# Patient Record
Sex: Male | Born: 1990
Health system: Southern US, Community
[De-identification: ages and names within clinical notes are randomized; demographics above are authoritative.]

---
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
## 2010-01-20 ENCOUNTER — Ambulatory Visit: Payer: Self-pay | Admitting: Diagnostic Radiology

## 2010-01-20 ENCOUNTER — Emergency Department (HOSPITAL_BASED_OUTPATIENT_CLINIC_OR_DEPARTMENT_OTHER): Admission: EM | Admit: 2010-01-20 | Discharge: 2010-01-20 | Payer: Self-pay | Admitting: Emergency Medicine

## 2015-06-26 ENCOUNTER — Encounter (HOSPITAL_COMMUNITY): Payer: Self-pay | Admitting: Emergency Medicine

## 2015-06-26 ENCOUNTER — Emergency Department (HOSPITAL_COMMUNITY)
Admission: EM | Admit: 2015-06-26 | Discharge: 2015-06-26 | Disposition: A | Discharge status: Home / Self Care | Payer: 59 | Attending: Emergency Medicine | Admitting: Emergency Medicine

## 2015-06-26 ENCOUNTER — Emergency Department (HOSPITAL_COMMUNITY): Payer: 59

## 2015-06-26 DIAGNOSIS — Y9289 Other specified places as the place of occurrence of the external cause: Secondary | ICD-10-CM | POA: Diagnosis not present

## 2015-06-26 DIAGNOSIS — Y9364 Activity, baseball: Secondary | ICD-10-CM | POA: Diagnosis not present

## 2015-06-26 DIAGNOSIS — S63502A Unspecified sprain of left wrist, initial encounter: Secondary | ICD-10-CM | POA: Insufficient documentation

## 2015-06-26 DIAGNOSIS — X58XXXA Exposure to other specified factors, initial encounter: Secondary | ICD-10-CM | POA: Diagnosis not present

## 2015-06-26 DIAGNOSIS — Y998 Other external cause status: Secondary | ICD-10-CM | POA: Insufficient documentation

## 2015-06-26 DIAGNOSIS — S6992XA Unspecified injury of left wrist, hand and finger(s), initial encounter: Secondary | ICD-10-CM | POA: Diagnosis present

## 2015-06-26 MED ORDER — IBUPROFEN 800 MG PO TABS: 800.0000 mg | ORAL_TABLET | Freq: Three times a day (TID) | ORAL | Status: DC

## 2015-06-26 MED ORDER — IBUPROFEN 800 MG PO TABS
800.0000 mg | ORAL_TABLET | Freq: Once | ORAL | Status: AC
  Administered 2015-06-26: 800 mg via ORAL
  Filled 2015-06-26: qty 1

## 2015-06-26 NOTE — ED Notes (Signed)
Per pt, states he was coming around 3 rd base and slipped-braced self with left wrist, now having pain and swelling

## 2015-06-26 NOTE — Discharge Instructions (Signed)
1. Medications: ibuprofen, usual home medications 2. Treatment: rest, drink plenty of fluids, ice, elevate 3. Follow Up: please followup with your primary doctor for discussion of your diagnoses and further evaluation after today's visit; if you do not have a primary care doctor use the resource guide provided to find one; please return to the ER for severe pain, numbness, tingling, color change, new or worsening symptoms   Sprain A sprain happens when the bands of tissue that connect bones and hold joints together (ligaments) stretch too much or tear. HOME CARE  Raise (elevate) the injured area to lessen puffiness (swelling).  Put ice on the injured area 2 times a day for 2-3 days.  Put ice in a plastic bag.  Place a towel between your skin and the bag.  Leave the ice on for 15 minutes.  Only take medicine as told by your doctor.  Protect your injured area until your pain and stiffness go away.  Do not get your cast or splint wet. Cover your cast or splint with a plastic bag when you shower or take a bath. Do not swim in a pool.  Your doctor may suggest exercises during your recovery to keep from getting stiff. GET HELP RIGHT AWAY IF:   Your cast or splint becomes damaged.  Your pain gets worse. MAKE SURE YOU:   Understand these instructions.  Will watch this condition.  Will get help right away if you are not doing well or get worse. Document Released: 02/25/2008 Document Revised: 06/29/2013 Document Reviewed: 09/20/2011 [facility] Patient Information 2015 [place], [place]. This information is not intended to replace advice given to you by your health care provider. Make sure you discuss any questions you have with your health care provider.   Emergency Department Resource Guide 1) Find a Doctor and Pay Out of Pocket Although you won't have to find out who is covered by your insurance plan, it is a good idea to ask around and get recommendations. You will then need to call  the office and see if the doctor you have chosen will accept you as a new patient and what types of options they offer for patients who are self-pay. Some doctors offer discounts or will set up payment plans for their patients who do not have insurance, but you will need to ask so you aren't surprised when you get to your appointment.  2) Contact Your Local Health Department Not all health departments have doctors that can see patients for sick visits, but many do, so it is worth a call to see if yours does. If you don't know where your local health department is, you can check in your phone book. The CDC also has a tool to help you locate your state's health department, and many state websites also have listings of all of their local health departments.  3) Find a Walk-in Clinic If your illness is not likely to be very severe or complicated, you may want to try a walk in clinic. These are popping up all over the country in pharmacies, drugstores, and shopping centers. They're usually staffed by nurse practitioners or physician assistants that have been trained to treat common illnesses and complaints. They're usually fairly quick and inexpensive. However, if you have serious medical issues or chronic medical problems, these are probably not your best option.  No Primary Care Doctor: - Call Health Connect at  [phone] - they can help you locate a primary care doctor that  accepts your insurance, provides  certain services, etc. - Physician Referral Service- [phone]  Chronic Pain Problems: Organization         Address  Phone   Notes  [facility]  [phone] Patients need to be referred by their primary care doctor.   Medication Assistance: Organization         Address  Phone   Notes  [facility] Medication [facility] [address] [place] [place], [place] [address] [phone] --Must be a resident of [facility] -- Must have NO insurance  coverage whatsoever (no Medicaid/ Medicare, etc.) -- The pt. MUST have a primary care doctor that directs their care regularly and follows them in the community   [facility]  [phone]   [employer]  [phone]    Agencies that provide inexpensive medical care: Organization         Address  Phone   Notes  [facility]  [phone]   [facility]    [phone]   [facility] [address] [place], [place] [address] [phone]   [facility] [address] [place], [place] [phone]   Planned Parenthood    [phone]   [facility]    [phone]   [facility]  [address], [place] Phone:  [phone], Fax:  [phone] Hours of Operation:  9 am - 6 pm, M-F.  Also accepts Medicaid/Medicare and self-pay.  [facility]  [address], [place] Phone: [phone], Fax: [phone]. Hours of Operation:  8:30 am - 5:30 pm, M-F.  Also accepts Medicaid and self-pay.  [facility] [place] [address] [place] Phone: [phone]   [facility] [address] Natasha Bence [place], [place] [phone], Ext. [phone] Mondays & Thursdays: 7-9 AM.  First 15 patients are seen on a first come, first serve basis.    Medicaid-accepting [facility] Providers:  Organization         Address  Phone   Notes  [facility] [address], [place] [phone] Also accepts self-pay patients.  [facility] [address] Laurell Josephs [place], [place]  [phone]   [facility] [address], [place] [phone]   [facility] [address] [place] [phone]   Renaye Rakers [address], [place]   [phone] Only accepts [place] patients after they have their  name applied to their card.   Self-Pay (no insurance) in [facility]:  Organization         Address  Phone   Notes  Sickle Cell Patients, [facility] Internal Medicine [address] [place], [place] [phone]   [facility]gent Care [address] [place], [place] [phone]   [facility] [place]  [address], [place] [phone]   [facility]/Dr. Osei-Bonsu  [address] [place] or [address], [place] [phone] Phone number for both [place] and [place] locations is the same.  [facility] [address] [place] [phone]   [facility] [address], [place] or [address] [phone]   [facility] [address]  [address] [place] [phone], phone; [phone], fax Sees patients 1st and 3rd Saturday of every month.  Must not qualify for public or private insurance (i.e. Medicaid, Medicare, [place] Health Choice, Veterans' Benefits)  Household income should be no more than 200% of the poverty level The clinic cannot treat you if you are pregnant or think you are pregnant  Sexually transmitted diseases are not treated at the clinic.    Dental Care: Organization         Address  Phone  Notes  [facility] Department of [facility] [address] [place], [place] [phone] Accepts children up to age 51 who are enrolled in [place] or [place] Health Choice; pregnant women with a Medicaid card; and children who have applied for Medicaid or [place] Health Choice, but were declined, whose parents can pay a reduced fee at time of service.  [facility] Department of [facility]  [address], [place] [phone] Accepts children up to age 45 who are enrolled in [place] or [place] Health Choice; pregnant women with a Medicaid card; and children who have applied for  Medicaid or [place] Health Choice, but were declined, whose parents can pay a reduced fee at time of service.  [facility]  [address] [place], [place] [phone] Patients are seen by appointment only. Walk-ins are not accepted. [facility] will see patients 26 years of age and older. Monday - Tuesday (8am-5pm) Most Wednesdays (8:30-5pm) $30 per visit, cash only  [facility]  [address], [facility] [phone] Patients are seen by appointment only. Walk-ins are not accepted. [facility] will see patients 70 years of age and older. One Wednesday Evening (Monthly: Volunteer Based).  $30 per visit, cash only  [employer]  [phone] for adults; Children under age 91, call Graduate Pediatric Dentistry at [phone]. Children aged 68-14, please call [phone] to request a pediatric application.  Dental services are provided in all areas of dental care including fillings, crowns and bridges, complete and partial dentures, implants, gum treatment, root canals, and extractions. Preventive care is also provided. Treatment is provided to both adults and children. Patients are selected via a lottery and there is often a waiting list.   [facility] [address], [place]  [phone] [email or web address]   [facility] [address] [place], [place] [phone], Ext. [phone] Second and Fourth Thursday of each month, opens at 6:30 AM; Clinic ends at 9 AM.  Patients are seen on a first-come first-served basis, and a limited number are seen during each clinic.   [facility]  [address] Ether Griffins [place], [place] [phone]   Eligibility Requirements You must have lived in [place], [place], or [place] counties for at least the last three months.   You cannot be eligible for state or federal sponsored [place], including [employer], [place],  or [employer].   You generally cannot be eligible for healthcare insurance through your employer.    How to apply: Eligibility screenings are held every Tuesday and Wednesday afternoon from 1:00 pm until 4:00 pm. You do not need an appointment for the interview!  [facility] [address] [place], [place] [phone]   [facility] Health Department  [phone]   [facility] Health Department  [phone]   [facility] Health Department  [phone]  Behavioral Health Resources in the Community: Intensive Outpatient Programs Organization         Address  Phone  Notes  [facility] [address], [place], [place] [phone]   [facility] [address], [place], [place] [phone]   [facility] [address], [place], [place]  [phone]   [facility] [address],  [place], [place] [phone] or [phone]   Substance Abuse Resources Organization         Address  Phone  Notes  [facility]  [phone]   [facility]  [phone]   [facility]  [phone]   [facility]  [phone]   Residential & Outpatient Substance Abuse Program  [phone]   Psychological Services Organization         Address  Phone  Notes  [facility]  [phone]- [phone]   [facility] [facility]  [phone]- [phone]   [facility] [address], [place] [phone] or [phone]    Mobile Crisis Teams Organization         Address  Phone  Notes  [facility], Mobile Crisis Care Unit  [phone]   [facility]  [address] [place], [place] [phone]   Doristine LocksSharon DeEsch [address] [place] [phone]    Self-Help/Support Groups Organization         Address  Phone             Notes  [facility] [place] - variety of support groups   336- [phone] Call for more information  Narcotics Anonymous (NA), [facility] [address], [place]  2 meetings at this location   Statisticianesidential Treatment Programs Organization         Address  Phone  Notes  [facility] [address],    [place]  [phone]   [facility]  [address], [place] [phone], [place], [place] [phone]   [facility] [address], [place] [phone] Admissions: 8am-3pm M-F  [facility] [address]    [place], [place] [phone]   [facility] [address], [place], [place] [phone]   [facility] [address]  [place], [place] [phone]   [facility] - Intensive Outpatient [address], [place], [place] [phone]   [facility] [address]  [place], [place] [phone] or [phone]   [facility] ([facility]) [address] [place], [place] [phone] Accepts Medicaid  [facility] [address]  [place] [phone] Substance Abuse/Addiction Treatment   [facility] Resources Organization         Address  Phone  Notes  [facility]  [phone]) [phone]   Angie FavaJulie Brannon, PhD [address] [place], [place]   [phone] or [phone]   [facility]   [address] [place], [place] [phone]   [facility] [address], [place], [place] [phone] Insurance/Medicaid/sponsorship through [employer] [facility]  [facility] [address]                                    [place], [place] [phone] Therapy/tele-psych/case  [facility] [address]   [place],  [place] [phone]    Dr. Lolly Mustache  [phone]   [facility]  [employer] [facility] Dept. 1) [address] [place] 2) [address], [place] 3)  [address] [place]  [address], [place] [phone]  [phone]   [facility] Child Abuse Hotline 3618663965 or [phone] (After Hours)

## 2015-06-26 NOTE — ED Provider Notes (Signed)
CSN: [id number]     Arrival date & time 06/26/15  1433 History   By signing my name below, I, Jarvis Morgan, attest that this documentation has been prepared under the direction and in the presence of Glean Hess, [place]. Electronically Signed: Jarvis Morgan, ED Scribe. 06/26/2015. 3:48 PM.    Chief Complaint  Patient presents with  . Wrist Injury    The history is provided by the patient. No language interpreter was used.     HPI Comments: Jerry Hancock is a 24 y.o. male who presents to the Emergency Department complaining of sudden onset, constant, moderate, left wrist pain s/p left wrist injury while playing softball last night. He states he was coming around 3rd base and slipped and braced himself with his left wrist. He reports associated swelling of his left wrist. He reports movement exacerbates his pain. He states has tried ice and tylenol with mild symptom relief. He denies head injury or LOC. He denies additional injury. He denies numbness, paresthesia, or weakness. He denies fever, chills, chest pain, SOB, abdominal pain, nausea, or vomiting.   History reviewed. No pertinent past medical history. History reviewed. No pertinent past surgical history. No family history on file. Social History  Substance Use Topics  . Smoking status: Never Smoker   . Smokeless tobacco: None  . Alcohol Use: No    Review of Systems  Constitutional: Negative for fever and chills.  Respiratory: Negative for shortness of breath.   Cardiovascular: Negative for chest pain.  Gastrointestinal: Negative for nausea, vomiting and abdominal pain.  Musculoskeletal: Positive for joint swelling and arthralgias.  Skin: Negative for color change.  Neurological: Negative for loss of consciousness, weakness, numbness and headaches.  All other systems reviewed and are negative.     Allergies  Review of patient's allergies indicates not on file.  Home Medications   Prior to Admission medications    Not on File   Triage Vitals: BP 148/81 mmHg  Pulse 82  Temp(Src) 99.1 F (37.3 C)  Resp 18  SpO2 100%  Physical Exam  Constitutional: He is oriented to person, place, and time. He appears well-developed and well-nourished. No distress.  HENT:  Head: Normocephalic and atraumatic.  Right Ear: External ear normal.  Left Ear: External ear normal.  Nose: Nose normal.  Mouth/Throat: Oropharynx is clear and moist.  Eyes: Conjunctivae and EOM are normal. Pupils are equal, round, and reactive to light. Right eye exhibits no discharge. Left eye exhibits no discharge. No scleral icterus.  Neck: Normal range of motion. Neck supple.  Cardiovascular: Normal rate, regular rhythm and intact distal pulses.   Pulmonary/Chest: Effort normal and breath sounds normal. No respiratory distress.  Musculoskeletal: He exhibits edema and tenderness.       Left wrist: He exhibits decreased range of motion, tenderness and swelling. He exhibits no crepitus and no deformity.  Mild TTP of radial aspect of left wrist with mild edema and decreased ROM due to pain. No significant erythema. Strength and sensation intact. Distal pulses intact. Cap refill <3 seconds. No snuffbox tenderness.  Neurological: He is alert and oriented to person, place, and time. He has normal strength. No sensory deficit.  Skin: Skin is warm and dry. No rash noted. He is not diaphoretic. No erythema. No pallor.  Psychiatric: He has a normal mood and affect. His behavior is normal. Judgment and thought content normal.  Nursing note and vitals reviewed.   ED Course  Procedures (including critical care time)  DIAGNOSTIC STUDIES: Oxygen  Saturation is 100% on RA, normal by my interpretation.    COORDINATION OF CARE:  3:12 PM- Will order imaging of left wrist. Pt advised of plan for treatment and pt agrees.  Labs Review Labs Reviewed - No data to display  Imaging Review Dg Wrist Complete Left  06/26/2015   CLINICAL DATA:  Softball  injury today with left wrist pain. Initial encounter.  EXAM: LEFT WRIST - COMPLETE 3+ VIEW  COMPARISON:  01/20/2010  FINDINGS: There is no evidence of acute fracture or dislocation. There is no evidence of arthropathy or other focal bone abnormality. Soft tissues are unremarkable.  IMPRESSION: No acute fracture identified.   Electronically Signed   By: Irish Lack M.D.   On: 06/26/2015 15:42   I have personally reviewed and evaluated these images as part of my medical decision-making.   EKG Interpretation None      MDM   Final diagnoses:  Wrist sprain, left, initial encounter    24 year old male presents with left wrist pain after falling and bracing himself with his left wrist yesterday while playing softball. He reports worsening pain and swelling since that time. He denies numbness, paresthesia, weakness, additional injury. He denies hitting his head or LOC.  Patient is afebrile. Vital signs stable. Mild TTP of radial aspect of left wrist with mild edema and decreased ROM due to pain. No snuffbox tenderness. Strength and sensation intact. Distal pulses intact. Cap refill <3 seconds.  Imaging of left wrist obtained, which demonstrates no fracture, dislocation, or soft tissue abnormality. Feel patient is stable for discharge at this time. Will apply brace and treat with ibuprofen and RICE therapy. Patient to follow up with PCP, resource list given. Return precautions discussed.  BP 148/81 mmHg  Pulse 61  Temp(Src) 99.1 F (37.3 C)  Resp 18  SpO2 99%    Mady Gemma, PA-C 06/27/15 1825  Gerhard Munch, MD 06/28/15 [id number]

## 2018-01-11 MED FILL — NALTREXONE 50 MG TABLET: 50 | 30 days supply | Qty: 30 | Fill #0

## 2018-01-11 MED FILL — MIRTAZAPINE 15 MG TABLET: 15 | 60 days supply | Qty: 30 | Fill #0

## 2018-02-12 MED FILL — NALTREXONE 50 MG TABLET: 50 | 30 days supply | Qty: 30 | Fill #1

## 2018-04-09 MED FILL — NALTREXONE 50 MG TABLET: 50 | 30 days supply | Qty: 30 | Fill #2

## 2018-04-09 MED FILL — MIRTAZAPINE 15 MG TABLET: 15 | 60 days supply | Qty: 30 | Fill #1

## 2020-06-19 ENCOUNTER — Other Ambulatory Visit: Payer: Self-pay

## 2020-06-19 ENCOUNTER — Ambulatory Visit
Admission: EM | Admit: 2020-06-19 | Discharge: 2020-06-19 | Disposition: A | Discharge status: Home / Self Care | Payer: Self-pay | Attending: Family Medicine | Admitting: Family Medicine

## 2020-06-19 ENCOUNTER — Ambulatory Visit (INDEPENDENT_AMBULATORY_CARE_PROVIDER_SITE_OTHER): Payer: Self-pay

## 2020-06-19 DIAGNOSIS — R0602 Shortness of breath: Secondary | ICD-10-CM | POA: Diagnosis not present

## 2020-06-19 DIAGNOSIS — J45909 Unspecified asthma, uncomplicated: Secondary | ICD-10-CM

## 2020-06-19 DIAGNOSIS — J209 Acute bronchitis, unspecified: Secondary | ICD-10-CM

## 2020-06-19 DIAGNOSIS — Z20822 Contact with and (suspected) exposure to covid-19: Secondary | ICD-10-CM

## 2020-06-19 DIAGNOSIS — R062 Wheezing: Secondary | ICD-10-CM

## 2020-06-19 MED ORDER — METHYLPREDNISOLONE SODIUM SUCC 125 MG IJ SOLR
80.0000 mg | Freq: Once | INTRAMUSCULAR | Status: AC
  Administered 2020-06-19: 80 mg via INTRAMUSCULAR

## 2020-06-19 MED ORDER — AZITHROMYCIN 250 MG PO TABS: 250.0000 mg | ORAL_TABLET | Freq: Every day | ORAL | 0 refills | Status: DC

## 2020-06-19 MED ORDER — ALBUTEROL SULFATE HFA 108 (90 BASE) MCG/ACT IN AERS
2.0000 | INHALATION_SPRAY | Freq: Once | RESPIRATORY_TRACT | Status: AC
  Administered 2020-06-19: 2 via RESPIRATORY_TRACT

## 2020-06-19 NOTE — ED Provider Notes (Addendum)
[name]    CSN: [id number] Arrival date & time: 06/19/20  2025      History   Chief Complaint Chief Complaint  Patient presents with  . Shortness of Breath    HPI Jerry Hancock is a 29 y.o. male.   Patient presents with 1 week history of wheezing and shortness of breath.  There is no history of asthma but he does say that he has had this other times and seems to be seasonally related.  He has a cough that is occasionally productive of dark sputum.  He denies fever or chills.  No other symptoms that are suggestive of Covid.  Has not had immunization  HPI  No past medical history on file.  There are no problems to display for this patient.   No past surgical history on file.     Home Medications    Prior to Admission medications   Medication Sig Start Date End Date Taking? Authorizing Provider  ibuprofen (ADVIL,MOTRIN) 800 MG tablet Take 1 tablet (800 mg total) by mouth 3 (three) times daily. 06/26/15   Mady Gemma, PA-C    Family History No family history on file.  Social History Social History   Tobacco Use  . Smoking status: Never Smoker  Substance Use Topics  . Alcohol use: No  . Drug use: Not on file     Allergies   Patient has no allergy information on record.   Review of Systems Review of Systems  Respiratory: Positive for cough, shortness of breath and wheezing.   All other systems reviewed and are negative.    Physical Exam Triage Vital Signs ED Triage Vitals  Enc Vitals Group     BP      Pulse      Resp      Temp      Temp src      SpO2      Weight      Height      Head Circumference      Peak Flow      Pain Score      Pain Loc      Pain Edu?      Excl. in GC?    No data found.  Updated Vital Signs There were no vitals taken for this visit.  Visual Acuity Right Eye Distance:   Left Eye Distance:   Bilateral Distance:    Right Eye Near:   Left Eye Near:    Bilateral Near:     Physical  Exam Vitals and nursing note reviewed.  Constitutional:      Appearance: He is well-developed and normal weight.  HENT:     Mouth/Throat:     Mouth: Mucous membranes are moist.  Cardiovascular:     Rate and Rhythm: Normal rate and regular rhythm.  Pulmonary:     Breath sounds: Examination of the right-upper field reveals wheezing. Examination of the left-upper field reveals wheezing and rhonchi. Examination of the right-middle field reveals wheezing. Examination of the left-middle field reveals wheezing. Examination of the right-lower field reveals wheezing. Examination of the left-lower field reveals wheezing. Wheezing and rhonchi present.  Musculoskeletal:     Cervical back: Normal range of motion.  Neurological:     Mental Status: He is alert.      UC Treatments / Results  Labs (all labs ordered are listed, but only abnormal results are displayed) Labs Reviewed - No data to display  EKG   Radiology  No results found.  Procedures Procedures (including critical care time)  Medications Ordered in UC Medications - No data to display  Initial Impression / Assessment and Plan / UC Course  I have reviewed the triage vital signs and the nursing notes.  Pertinent labs & imaging results that were available during my care of the patient were reviewed by me and considered in my medical decision making (see chart for details).     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  X-ray interpretation was possible right middle lobe pneumonia is so will do Covid testing and azithromycin to albuterol and steroid injection Final Clinical Impressions(s) / UC Diagnoses   Final diagnoses:  None   Discharge Instructions   None    ED Prescriptions    None     PDMP not reviewed this encounter.   Frederica Kuster, MD 06/19/20 1207    Frederica Kuster, MD 06/19/20 [phone]

## 2020-06-19 NOTE — ED Triage Notes (Signed)
Pt c/o increase SOB with wheezing x3 days. States had this with cold and congestion 6weeks ago, it went away and came back a few times.states this time is worse.

## 2020-06-20 LAB — SARS-COV-2, NAA 2 DAY TAT

## 2020-06-20 LAB — NOVEL CORONAVIRUS, NAA: SARS-CoV-2, NAA: NOT DETECTED

## 2020-07-23 ENCOUNTER — Other Ambulatory Visit: Payer: Self-pay

## 2020-07-23 ENCOUNTER — Ambulatory Visit (INDEPENDENT_AMBULATORY_CARE_PROVIDER_SITE_OTHER): Payer: Self-pay

## 2020-07-23 ENCOUNTER — Ambulatory Visit
Admission: RE | Admit: 2020-07-23 | Discharge: 2020-07-23 | Disposition: A | Discharge status: Home / Self Care | Payer: Self-pay | Source: Ambulatory Visit | Attending: Emergency Medicine | Admitting: Emergency Medicine

## 2020-07-23 VITALS — BP 137/82 | HR 106 | Temp 97.8°F | Resp 22

## 2020-07-23 DIAGNOSIS — R0602 Shortness of breath: Secondary | ICD-10-CM

## 2020-07-23 DIAGNOSIS — F17200 Nicotine dependence, unspecified, uncomplicated: Secondary | ICD-10-CM

## 2020-07-23 DIAGNOSIS — Z1152 Encounter for screening for COVID-19: Secondary | ICD-10-CM

## 2020-07-23 MED ORDER — ALBUTEROL SULFATE HFA 108 (90 BASE) MCG/ACT IN AERS
2.0000 | INHALATION_SPRAY | Freq: Once | RESPIRATORY_TRACT | Status: AC
  Administered 2020-07-23: 2 via RESPIRATORY_TRACT

## 2020-07-23 MED ORDER — AEROCHAMBER PLUS FLO-VU MEDIUM MISC: 1.0000 | Freq: Once | 0 refills | Status: AC

## 2020-07-23 MED ORDER — PREDNISONE 50 MG PO TABS: 50.0000 mg | ORAL_TABLET | Freq: Every day | ORAL | 0 refills | Status: AC

## 2020-07-23 MED ORDER — METHYLPREDNISOLONE SODIUM SUCC 125 MG IJ SOLR
125.0000 mg | Freq: Once | INTRAMUSCULAR | Status: AC
  Administered 2020-07-23: 125 mg via INTRAMUSCULAR

## 2020-07-23 NOTE — ED Triage Notes (Signed)
Pt states he was prescribed an inhaler last visit and received a steroid shot that helped ease his symptoms. Pt states the inhaler ran out about 3 days ago and SOB congestion returned and are worsening. Pt is aox4 and ambulatory.

## 2020-07-23 NOTE — ED Provider Notes (Signed)
[name]    CSN: [id number] Arrival date & time: 07/23/20  0913      History   Chief Complaint Chief Complaint  Patient presents with  . Shortness of Breath    x 3 days, inhaler ran out  . Nasal Congestion    x 3 days    HPI Jerry Hancock is a 29 y.o. male  With history of smoking presenting for recurrent shortness of breath and wheezing.  Patient previously seen 06/19/2020 for this: Please see those records, viewed by me at time of visit.  Was given an inhaler, steroid shot, discharged with azithromycin due to possible infiltrate on x-ray.  States he is continue to smoke: Reports compliance with antibiotics.  Had complete improvement.  Denies inciting event, trauma.  No chest pain or palpitations, lightheadedness, dizziness, nausea or vomiting.  Denies recent illness, fever.  History reviewed. No pertinent past medical history.  There are no problems to display for this patient.   History reviewed. No pertinent surgical history.     Home Medications    Prior to Admission medications   Medication Sig Start Date End Date Taking? Authorizing Provider  predniSONE (DELTASONE) 50 MG tablet Take 1 tablet (50 mg total) by mouth daily with breakfast. 07/23/20   Hall-Potvin, [place], PA-C    Family History History reviewed. No pertinent family history.  Social History Social History   Tobacco Use  . Smoking status: Current Every Day Smoker    Packs/day: 0.50    Types: Cigarettes  . Smokeless tobacco: Never Used  Vaping Use  . Vaping Use: Never used  Substance Use Topics  . Alcohol use: Yes    Comment: occ  . Drug use: Never     Allergies   Patient has no known allergies.   Review of Systems Review of Systems  Constitutional: Negative for fatigue and fever.  Respiratory: Positive for chest tightness, shortness of breath and wheezing. Negative for cough.   Cardiovascular: Negative for chest pain and palpitations.  Gastrointestinal: Negative for  abdominal pain, diarrhea and vomiting.  Musculoskeletal: Negative for arthralgias and myalgias.  Skin: Negative for rash and wound.  Neurological: Negative for speech difficulty and headaches.  All other systems reviewed and are negative.    Physical Exam Triage Vital Signs ED Triage Vitals  Enc Vitals Group     BP 07/23/20 0923 137/82     Pulse Rate 07/23/20 0923 (!) 106     Resp 07/23/20 0923 (!) 22     Temp 07/23/20 0923 97.8 F (36.6 C)     Temp Source 07/23/20 0923 Oral     SpO2 07/23/20 0923 92 %     Weight --      Height --      Head Circumference --      Peak Flow --      Pain Score 07/23/20 0926 0     Pain Loc --      Pain Edu? --      Excl. in GC? --    No data found.  Updated Vital Signs BP 137/82 (BP Location: Left Arm)   Pulse (!) 106   Temp 97.8 F (36.6 C) (Oral)   Resp (!) 22   SpO2 92%   Visual Acuity Right Eye Distance:   Left Eye Distance:   Bilateral Distance:    Right Eye Near:   Left Eye Near:    Bilateral Near:     Physical Exam Constitutional:  General: He is not in acute distress.    Appearance: He is well-developed and normal weight. He is not toxic-appearing or diaphoretic.  HENT:     Head: Normocephalic and atraumatic.     Mouth/Throat:     Mouth: Mucous membranes are moist.     Pharynx: Oropharynx is clear. No pharyngeal swelling or oropharyngeal exudate.  Eyes:     General: No scleral icterus.    Pupils: Pupils are equal, round, and reactive to light.  Neck:     Vascular: No JVD.     Trachea: No tracheal deviation.  Cardiovascular:     Rate and Rhythm: Normal rate and regular rhythm.     Comments: HR 98 at bedside Pulmonary:     Effort: Pulmonary effort is normal. Tachypnea present. No accessory muscle usage.     Breath sounds: No stridor. Wheezing and rhonchi present. No decreased breath sounds or rales.     Comments: diffuse Chest:     Chest wall: No deformity, tenderness or crepitus.  Musculoskeletal:         General: Normal range of motion.     Right lower leg: No tenderness. No edema.     Left lower leg: No tenderness. No edema.  Lymphadenopathy:     Cervical: No cervical adenopathy.  Skin:    General: Skin is warm.     Capillary Refill: Capillary refill takes less than 2 seconds.     Coloration: Skin is not cyanotic, jaundiced or pale.  Neurological:     General: No focal deficit present.     Mental Status: He is alert and oriented to person, place, and time.  Psychiatric:        Mood and Affect: Mood normal.        Behavior: Behavior normal.      UC Treatments / Results  Labs (all labs ordered are listed, but only abnormal results are displayed) Labs Reviewed  NOVEL CORONAVIRUS, NAA    EKG   Radiology DG Chest 2 View  Result Date: 07/23/2020 CLINICAL DATA:  29 year old male with shortness of breath for 1 month. EXAM: CHEST - 2 VIEW COMPARISON:  Chest radiographs 06/19/2020 FINDINGS: Suboptimal radiographic technique similar to the study in September. Large lung volumes, but as before maintained curvature of the diaphragm. Mediastinal contours remain normal. Visualized tracheal air column is within normal limits. Lung markings appear within normal limits. No pneumothorax or pleural effusion. No osseous abnormality identified. IMPRESSION: Lung volumes upper limits of normal versus hyperinflated. No acute cardiopulmonary abnormality. Electronically Signed   By: Odessa Fleming M.D.   On: 07/23/2020 10:01    Procedures Procedures (including critical care time)  Medications Ordered in UC Medications  albuterol (VENTOLIN HFA) 108 (90 Base) MCG/ACT inhaler 2 puff (2 puffs Inhalation Given 07/23/20 0958)  methylPREDNISolone sodium succinate (SOLU-MEDROL) 125 mg/2 mL injection 125 mg (125 mg Intramuscular Given 07/23/20 0958)    Initial Impression / Assessment and Plan / UC Course  I have reviewed the triage vital signs and the nursing notes.  Pertinent labs & imaging results that were  available during my care of the patient were reviewed by me and considered in my medical decision making (see chart for details).     Patient afebrile, nontoxic, with SpO2 92%.  Covid PCR pending.  Patient to quarantine until results are back.  Chest x-ray with possible hyperinflation, otherwise unremarkable.  Given Solu-Medrol, albuterol in office which he tolerated well.  Reporting some improvement at time of discharge.  Provided contact information for both pulmonology and primary care for further evaluation and management.  Encouraged appropriate inhaler use, will add prednisone for 5 days hereafter.  We will treat supportively as outlined below.  Return precautions discussed, patient verbalized understanding and is agreeable to plan. Final Clinical Impressions(s) / UC Diagnoses   Final diagnoses:  Encounter for screening for COVID-19  SOB (shortness of breath)  Current smoker     Discharge Instructions     Your COVID test is pending - it is important to quarantine / isolate at home until your results are back. If you test positive and would like further evaluation for persistent or worsening symptoms, you may schedule an E-visit or virtual (video) visit throughout the [facility] app or website.  PLEASE NOTE: If you develop severe chest pain or shortness of breath please go to the ER or call [phone] for further evaluation --> DO NOT schedule electronic or virtual visits for this. Please call our office for further guidance / recommendations as needed.  For information about the Covid vaccine, please visit [email or web address]    ED Prescriptions    Medication Sig Dispense Auth. Provider   predniSONE (DELTASONE) 50 MG tablet Take 1 tablet (50 mg total) by mouth daily with breakfast. 5 tablet Hall-Potvin, [place], PA-C     PDMP not reviewed this encounter.   Hall-Potvin, [place], [place] 07/23/20 1014

## 2020-07-23 NOTE — Discharge Instructions (Addendum)
Your COVID test is pending - it is important to quarantine / isolate at home until your results are back. °If you test positive and would like further evaluation for persistent or worsening symptoms, you may schedule an E-visit or virtual (video) visit throughout the [facility] My[facility] app or website. ° °PLEASE NOTE: If you develop severe chest pain or shortness of breath please go to the ER or call 9-1-1 for further evaluation --> DO NOT schedule electronic or virtual visits for this. °Please call our office for further guidance / recommendations as needed. ° °For information about the Covid vaccine, please visit [email or web address] °

## 2020-07-25 LAB — SARS-COV-2, NAA 2 DAY TAT

## 2020-07-25 LAB — NOVEL CORONAVIRUS, NAA: SARS-CoV-2, NAA: NOT DETECTED

## 2020-07-25 LAB — SPECIMEN STATUS REPORT

## 2022-03-19 IMAGING — DX DG CHEST 2V
2 series · 2 of 2 positions shown · non-contrast
Comparison: None.

CLINICAL DATA: Wheezing and shortness of breath for 3 days

EXAM:
CHEST - 2 VIEW

[chest pa]
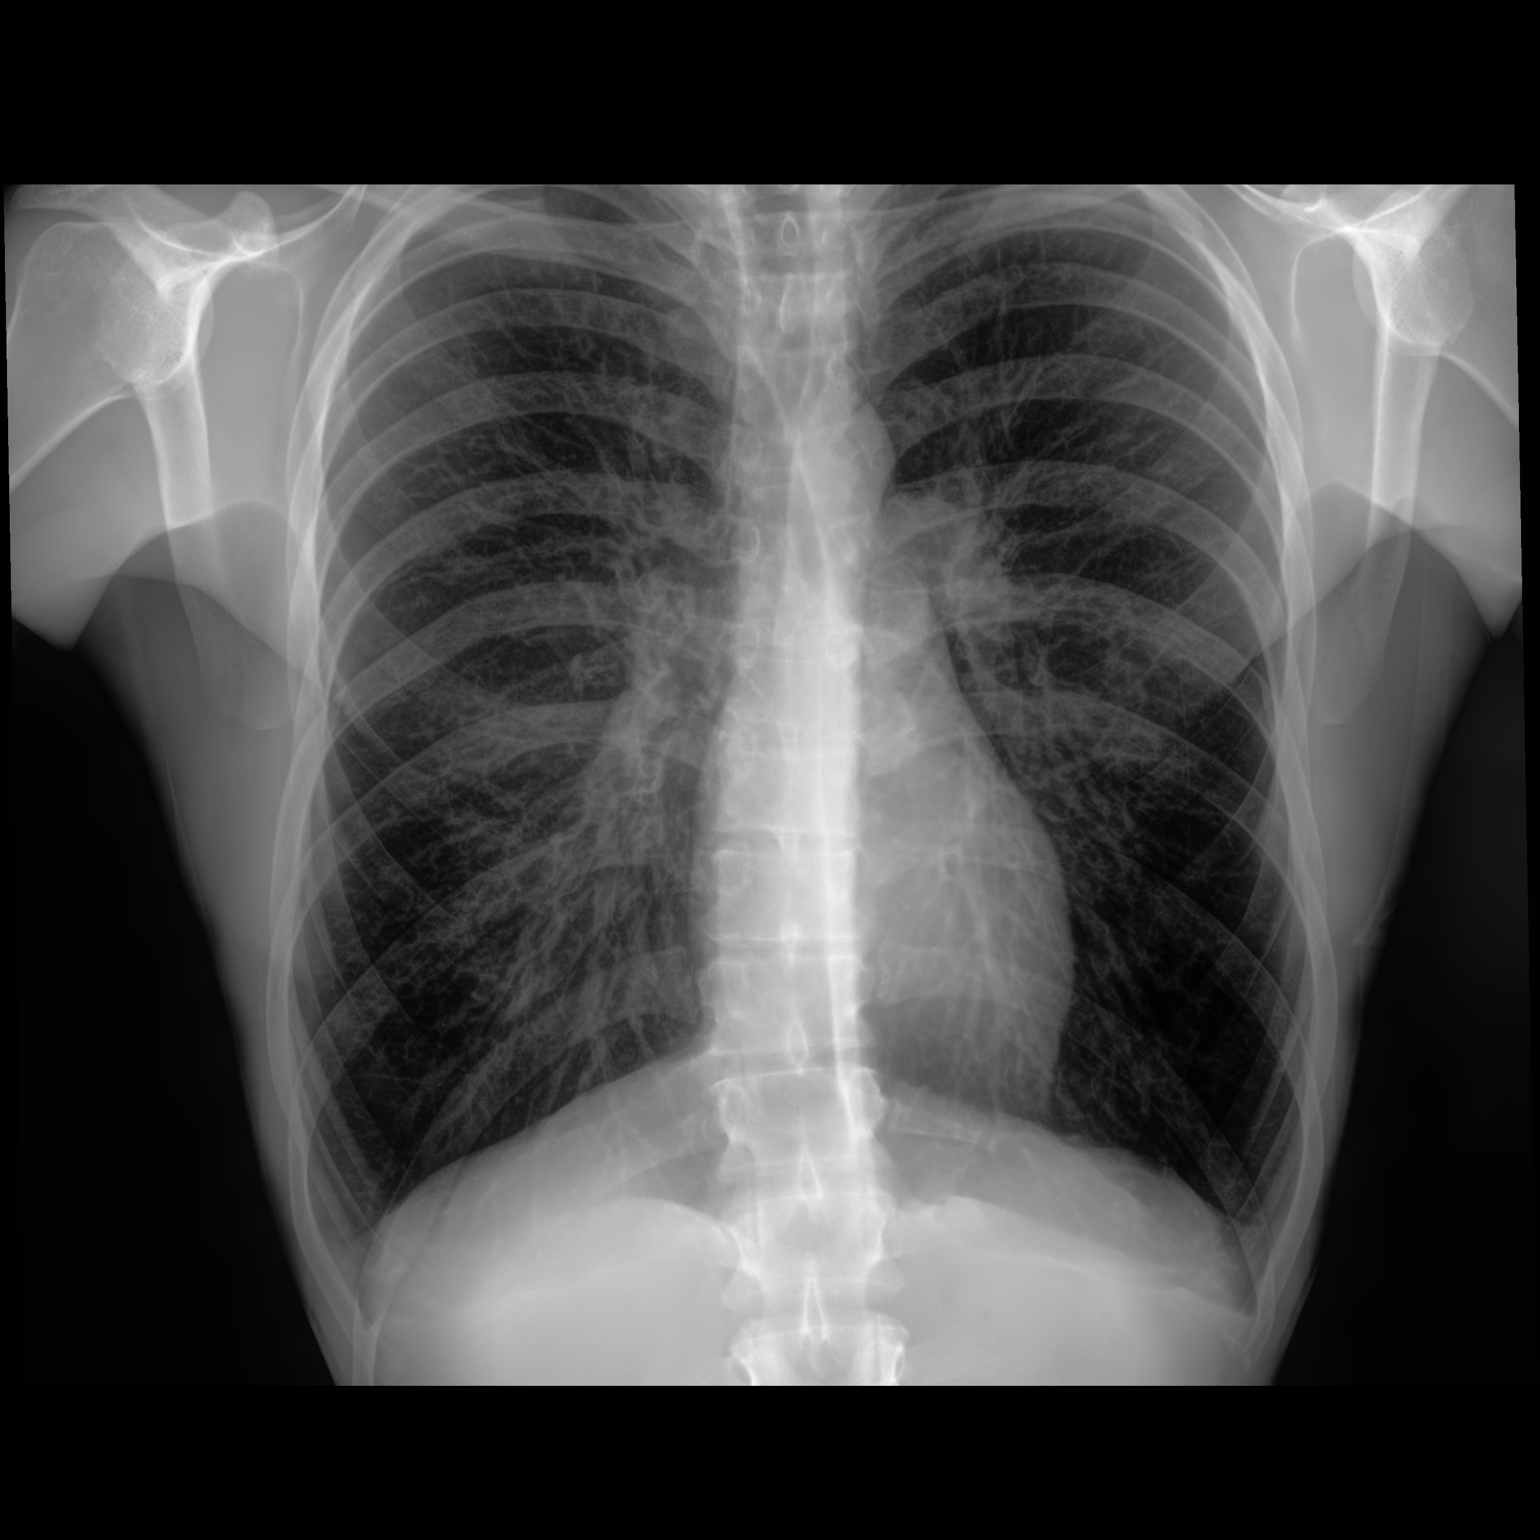

[chest lat]
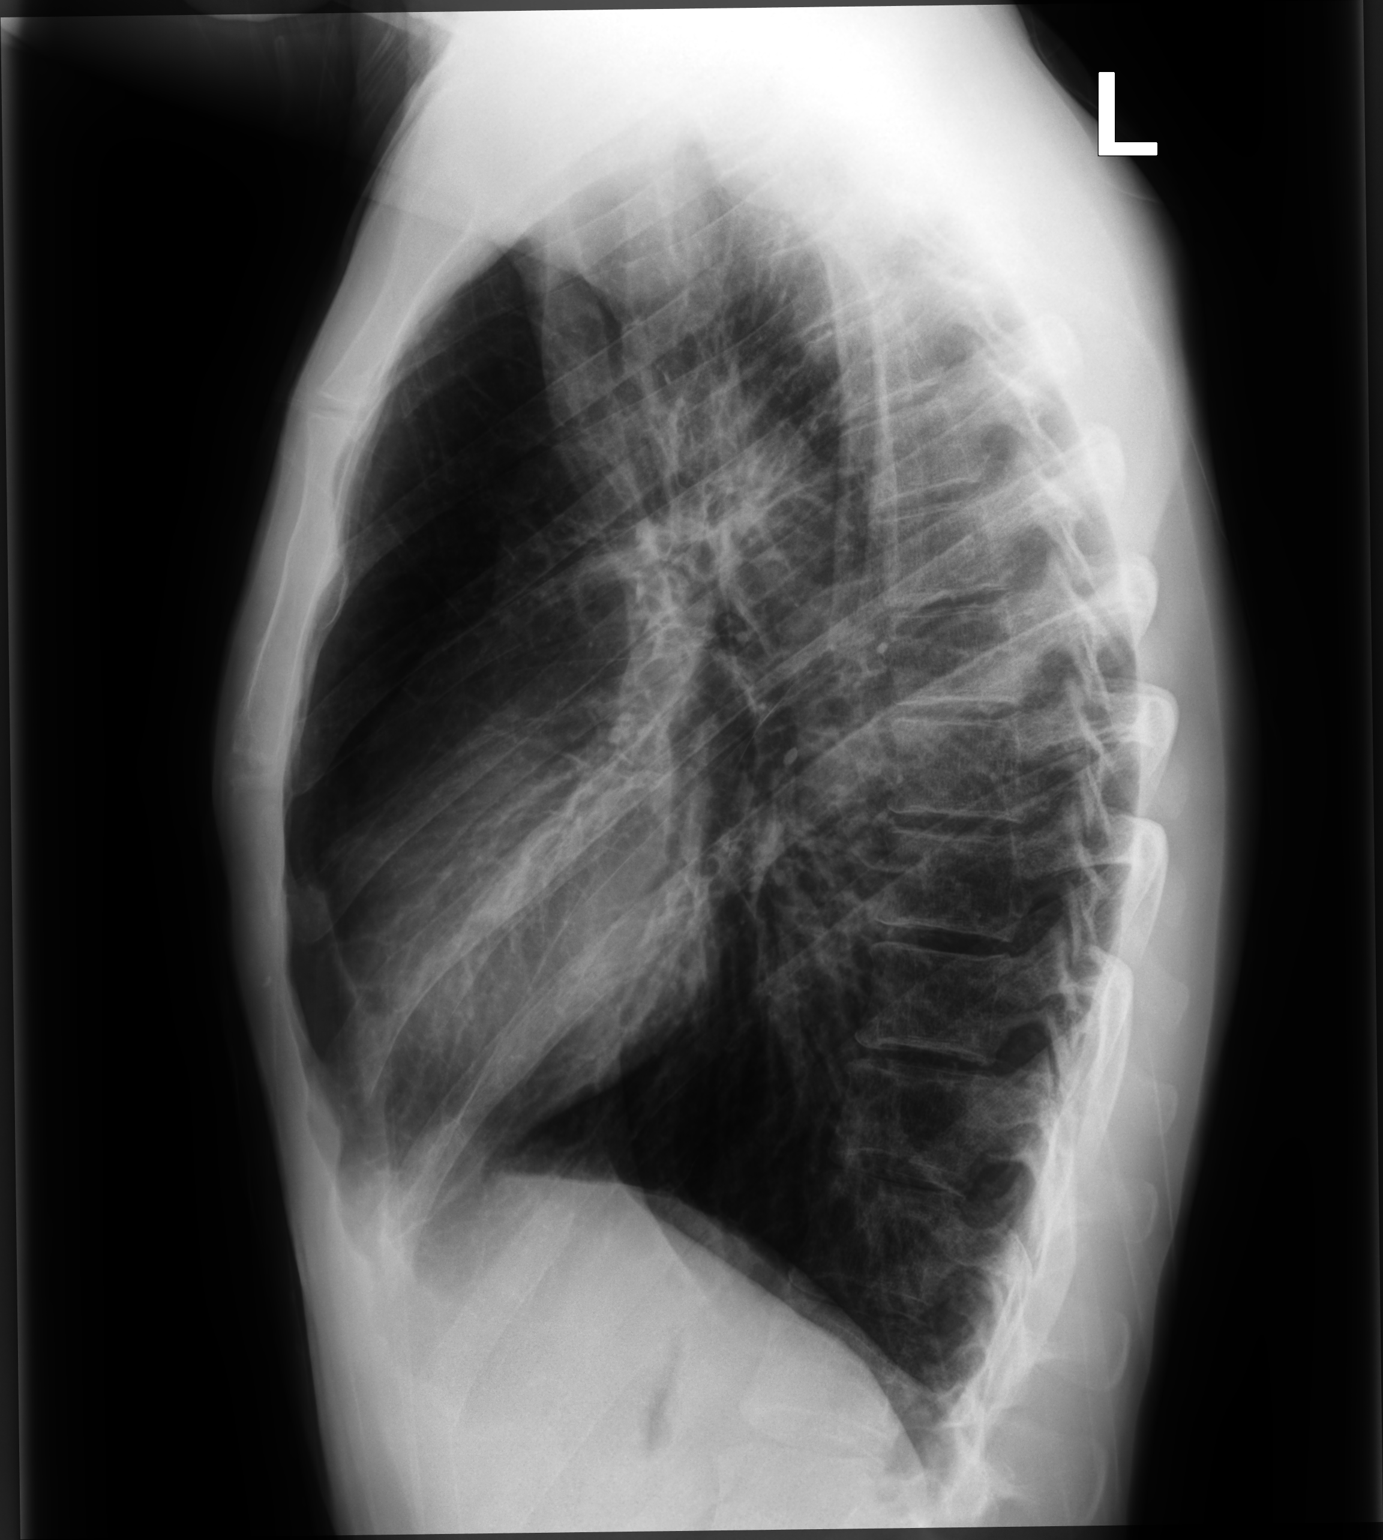

[2 of 2 positions shown; findings below may reference images not displayed]

FINDINGS: Cardiac shadow is within normal limits. The lungs are well aerated
bilaterally. Patchy right middle lobe infiltrate is seen. No sizable
effusion is noted. Vigorous inspiratory effort is seen. No bony
abnormality is noted.
IMPRESSION: Right middle lobe infiltrate.
